# Patient Record
Sex: Female | Born: 1975 | Race: Black or African American | Hispanic: No | Marital: Married | Smoking: Never smoker
Health system: Southern US, Community
[De-identification: ages and names within clinical notes are randomized; demographics above are authoritative.]

## PROBLEM LIST (undated history)

## (undated) DIAGNOSIS — D649 Anemia, unspecified: Secondary | ICD-10-CM

## (undated) HISTORY — PX: OVARIAN CYST REMOVAL: SHX89

## (undated) HISTORY — DX: Anemia, unspecified: D64.9

## (undated) HISTORY — PX: OOPHORECTOMY: SHX86

---

## 2012-12-21 ENCOUNTER — Ambulatory Visit: Payer: Self-pay | Admitting: Family Medicine

## 2012-12-27 ENCOUNTER — Encounter: Payer: Self-pay | Admitting: Obstetrics & Gynecology

## 2013-01-02 ENCOUNTER — Encounter: Payer: Self-pay | Admitting: Obstetrics & Gynecology

## 2013-01-02 ENCOUNTER — Ambulatory Visit: Payer: BC Managed Care – PPO | Admitting: Family Medicine

## 2013-01-02 ENCOUNTER — Ambulatory Visit (INDEPENDENT_AMBULATORY_CARE_PROVIDER_SITE_OTHER): Payer: BC Managed Care – PPO | Admitting: Obstetrics & Gynecology

## 2013-01-02 VITALS — BP 105/64 | HR 85 | Temp 97.2°F | Resp 18 | Ht 68.0 in | Wt 158.0 lb

## 2013-01-02 DIAGNOSIS — Z9889 Other specified postprocedural states: Secondary | ICD-10-CM

## 2013-01-02 DIAGNOSIS — Z90721 Acquired absence of ovaries, unilateral: Secondary | ICD-10-CM

## 2013-01-02 DIAGNOSIS — Z87410 Personal history of cervical dysplasia: Secondary | ICD-10-CM

## 2013-01-02 DIAGNOSIS — Z1151 Encounter for screening for human papillomavirus (HPV): Secondary | ICD-10-CM

## 2013-01-02 DIAGNOSIS — Z01419 Encounter for gynecological examination (general) (routine) without abnormal findings: Secondary | ICD-10-CM

## 2013-01-02 NOTE — Progress Notes (Signed)
  Subjective:     Anne Hooper is a 37 y.o. female here for a routine exam.  Current complaints: wants birth control, has tried most methods.  Considering another nexplanon or paragard.  Personal health questionnaire reviewed: yes.   Gynecologic History Patient's last menstrual period was 12/17/2012. Contraception: coitus interruptus Last Pap: 2013. Results were: normal (in [place]) Last mammogram: n/a. Results were: n/a  Obstetric History OB History   Grav Para Term Preterm Abortions TAB SAB Ect Mult Living   3 3        3      # Outc Date GA Lbr Len/2nd Wgt Sex Del Anes PTL Lv   1 PAR            2 PAR            3 PAR                The following portions of the patient's history were reviewed and updated as appropriate: allergies, current medications, past family history, past medical history, past social history, past surgical history and problem list.  Review of Systems Pertinent items are noted in HPI.    Objective:   Filed Vitals:   01/02/13 1028  BP: 105/64  Pulse: 85  Temp: 97.2 F (36.2 C)  Resp: 18  Height: 5\' 8"  (1.727 m)  Weight: 158 lb (71.668 kg)  SpO2: 98%      Vitals:  WNL General appearance: alert, cooperative and no distress Head: Normocephalic, without obvious abnormality, atraumatic Eyes: negative Throat: lips, mucosa, and tongue normal; teeth and gums normal Lungs: clear to auscultation bilaterally Breasts: normal appearance, no masses or tenderness, No nipple retraction or dimpling, No nipple discharge or bleeding Heart: regular rate and rhythm Abdomen: soft, non-tender; bowel sounds normal; no masses,  no organomegaly Pelvic: cervix normal in appearance, external genitalia normal, no adnexal masses or tenderness, no bladder tenderness, no cervical motion tenderness, perianal skin: no external genital warts noted, urethra without abnormality or discharge, uterus retroverted with small fibroids noted, and vagina normal without  discharge Extremities: no edema, redness or tenderness in the calves or thighs Skin: no lesions or rash Lymph nodes: Axillary adenopathy: none        Assessment:    Healthy female exam.    Plan:    Education reviewed: self breast exams and skin cancer screening. Contraception: coitus interruptus. Follow up in: 3 weeks.  Getting records from [place] practice about ovarian surgery, pap smears / LEEP.

## 2013-01-02 NOTE — Patient Instructions (Addendum)
Intrauterine Device Information An intrauterine device (IUD) is inserted into your uterus and prevents pregnancy. There are 2 types of IUDs available:  Copper IUD. This type of IUD is wrapped in copper wire and is placed inside the uterus. Copper makes the uterus and fallopian tubes produce a fluid that kills sperm. The copper IUD can stay in place for 10 years.  Hormone IUD. This type of IUD contains the hormone progestin (synthetic progesterone). The hormone thickens the cervical mucus and prevents sperm from entering the uterus, and it also thins the uterine lining to prevent implantation of a fertilized egg. The hormone can weaken or kill the sperm that get into the uterus. The hormone IUD can stay in place for 5 years. Your caregiver will make sure you are a good candidate for a contraceptive IUD. Discuss with your caregiver the possible side effects. ADVANTAGES  It is highly effective, reversible, long-acting, and low maintenance.  There are no estrogen-related side effects.  An IUD can be used when breastfeeding.  It is not associated with weight gain.  It works immediately after insertion.  The copper IUD does not interfere with your female hormones.  The progesterone IUD can make heavy menstrual periods lighter.  The progesterone IUD can be used for 5 years.  The copper IUD can be used for 10 years. DISADVANTAGES  The progesterone IUD can be associated with irregular bleeding patterns.  The copper IUD can make your menstrual flow heavier and more painful.  You may experience cramping and vaginal bleeding after insertion. Document Released: 08/09/2004 Document Revised: 11/28/2011 Document Reviewed: 01/08/2011 [facility] Patient Information 2013 [place], [place]. Contraception Choices Contraception (birth control) is the use of any methods or devices to prevent pregnancy. Below are some methods to help avoid pregnancy. HORMONAL METHODS   Contraceptive implant. This is a  thin, plastic tube containing progesterone hormone. It does not contain estrogen hormone. Your caregiver inserts the tube in the inner part of the upper arm. The tube can remain in place for up to 3 years. After 3 years, the implant must be removed. The implant prevents the ovaries from releasing an egg (ovulation), thickens the cervical mucus which prevents sperm from entering the uterus, and thins the lining of the inside of the uterus.  Progesterone-only injections. These injections are given every 3 months by your caregiver to prevent pregnancy. This synthetic progesterone hormone stops the ovaries from releasing eggs. It also thickens cervical mucus and changes the uterine lining. This makes it harder for sperm to survive in the uterus.  Birth control pills. These pills contain estrogen and progesterone hormone. They work by stopping the egg from forming in the ovary (ovulation). Birth control pills are prescribed by a caregiver.Birth control pills can also be used to treat heavy periods.  Minipill. This type of birth control pill contains only the progesterone hormone. They are taken every day of each month and must be prescribed by your caregiver.  Birth control patch. The patch contains hormones similar to those in birth control pills. It must be changed once a week and is prescribed by a caregiver.  Vaginal ring. The ring contains hormones similar to those in birth control pills. It is left in the vagina for 3 weeks, removed for 1 week, and then a new one is put back in place. The patient must be comfortable inserting and removing the ring from the vagina.A caregiver's prescription is necessary.  Emergency contraception. Emergency contraceptives prevent pregnancy after unprotected sexual intercourse. This pill can  be taken right after sex or up to 5 days after unprotected sex. It is most effective the sooner you take the pills after having sexual intercourse. Emergency contraceptive pills are  available without a prescription. Check with your pharmacist. Do not use emergency contraception as your only form of birth control. BARRIER METHODS   Female condom. This is a thin sheath (latex or rubber) that is worn over the penis during sexual intercourse. It can be used with spermicide to increase effectiveness.  Female condom. This is a soft, loose-fitting sheath that is put into the vagina before sexual intercourse.  Diaphragm. This is a soft, latex, dome-shaped barrier that must be fitted by a caregiver. It is inserted into the vagina, along with a spermicidal jelly. It is inserted before intercourse. The diaphragm should be left in the vagina for 6 to 8 hours after intercourse.  Cervical cap. This is a round, soft, latex or plastic cup that fits over the cervix and must be fitted by a caregiver. The cap can be left in place for up to 48 hours after intercourse.  Sponge. This is a soft, circular piece of polyurethane foam. The sponge has spermicide in it. It is inserted into the vagina after wetting it and before sexual intercourse.  Spermicides. These are chemicals that kill or block sperm from entering the cervix and uterus. They come in the form of creams, jellies, suppositories, foam, or tablets. They do not require a prescription. They are inserted into the vagina with an applicator before having sexual intercourse. The process must be repeated every time you have sexual intercourse. INTRAUTERINE CONTRACEPTION  Intrauterine device (IUD). This is a T-shaped device that is put in a woman's uterus during a menstrual period to prevent pregnancy. There are 2 types:  Copper IUD. This type of IUD is wrapped in copper wire and is placed inside the uterus. Copper makes the uterus and fallopian tubes produce a fluid that kills sperm. It can stay in place for 10 years.  Hormone IUD. This type of IUD contains the hormone progestin (synthetic progesterone). The hormone thickens the cervical mucus  and prevents sperm from entering the uterus, and it also thins the uterine lining to prevent implantation of a fertilized egg. The hormone can weaken or kill the sperm that get into the uterus. It can stay in place for 5 years. PERMANENT METHODS OF CONTRACEPTION  Female tubal ligation. This is when the woman's fallopian tubes are surgically sealed, tied, or blocked to prevent the egg from traveling to the uterus.  Female sterilization. This is when the female has the tubes that carry sperm tied off (vasectomy).This blocks sperm from entering the vagina during sexual intercourse. After the procedure, the man can still ejaculate fluid (semen). NATURAL PLANNING METHODS  Natural family planning. This is not having sexual intercourse or using a barrier method (condom, diaphragm, cervical cap) on days the woman could become pregnant.  Calendar method. This is keeping track of the length of each menstrual cycle and identifying when you are fertile.  Ovulation method. This is avoiding sexual intercourse during ovulation.  Symptothermal method. This is avoiding sexual intercourse during ovulation, using a thermometer and ovulation symptoms.  Post-ovulation method. This is timing sexual intercourse after you have ovulated. Regardless of which type or method of contraception you choose, it is important that you use condoms to protect against the transmission of sexually transmitted diseases (STDs). Talk with your caregiver about which form of contraception is most appropriate for you. Document  Released: 09/05/2005 Document Revised: 11/28/2011 Document Reviewed: 01/12/2011 [facility] Patient Information 2013 [place], [place].

## 2013-01-18 ENCOUNTER — Encounter: Payer: Self-pay | Admitting: Obstetrics & Gynecology

## 2013-01-18 ENCOUNTER — Encounter: Payer: Self-pay | Admitting: *Deleted

## 2013-01-18 ENCOUNTER — Telehealth: Payer: Self-pay | Admitting: *Deleted

## 2013-01-18 NOTE — Telephone Encounter (Signed)
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  Pt will need pap in 1 year.  Pt to be notified by RN. ------

## 2013-01-18 NOTE — Telephone Encounter (Signed)
Pap letter mailed to pt's home address.

## 2013-05-22 ENCOUNTER — Ambulatory Visit: Payer: BC Managed Care – PPO | Admitting: Family Medicine

## 2013-06-21 ENCOUNTER — Ambulatory Visit (INDEPENDENT_AMBULATORY_CARE_PROVIDER_SITE_OTHER): Payer: BC Managed Care – PPO

## 2013-06-21 ENCOUNTER — Ambulatory Visit (INDEPENDENT_AMBULATORY_CARE_PROVIDER_SITE_OTHER): Payer: BC Managed Care – PPO | Admitting: Sports Medicine

## 2013-06-21 ENCOUNTER — Encounter: Payer: Self-pay | Admitting: Sports Medicine

## 2013-06-21 ENCOUNTER — Ambulatory Visit: Payer: BC Managed Care – PPO | Admitting: Physician Assistant

## 2013-06-21 VITALS — BP 107/62 | HR 85 | Wt 151.0 lb

## 2013-06-21 DIAGNOSIS — M542 Cervicalgia: Secondary | ICD-10-CM

## 2013-06-21 DIAGNOSIS — M25469 Effusion, unspecified knee: Secondary | ICD-10-CM

## 2013-06-21 DIAGNOSIS — Z0000 Encounter for general adult medical examination without abnormal findings: Secondary | ICD-10-CM | POA: Insufficient documentation

## 2013-06-21 DIAGNOSIS — M25569 Pain in unspecified knee: Secondary | ICD-10-CM

## 2013-06-21 DIAGNOSIS — D72819 Decreased white blood cell count, unspecified: Secondary | ICD-10-CM

## 2013-06-21 DIAGNOSIS — M222X1 Patellofemoral disorders, right knee: Secondary | ICD-10-CM

## 2013-06-21 DIAGNOSIS — Z299 Encounter for prophylactic measures, unspecified: Secondary | ICD-10-CM

## 2013-06-21 DIAGNOSIS — G44209 Tension-type headache, unspecified, not intractable: Secondary | ICD-10-CM | POA: Insufficient documentation

## 2013-06-21 MED ORDER — MELOXICAM 15 MG PO TABS: ORAL_TABLET | ORAL | Status: DC

## 2013-06-21 NOTE — Progress Notes (Signed)
  Subjective:    CC: Establish care.   HPI:  Neck pain: Localized from the right side of the neck up into the occiput. Not positional, hasn't tried anything. No pain radiating down into her arms. No trauma.  Bilateral knee pain: Present for weeks, anterior, worse when going up and down stairs, squatting, or sitting with knees bent. Hasn't tried any medications or rehabilitation for this. Some grind present.  Cervical cancer screening: History of cervical dysplasia status post LEEP, currently being seen by OB/GYN, Dr. Penne Lash.  Preventive measure: Due for flu vaccine, up-to-date on tetanus.  Past medical history, Surgical history, Family history not pertinant except as noted below, Social history, Allergies, and medications have been entered into the medical record, reviewed, and no changes needed.   Review of Systems: No headache, visual changes, nausea, vomiting, diarrhea, constipation, dizziness, abdominal pain, skin rash, fevers, chills, night sweats, swollen lymph nodes, weight loss, chest pain, body aches, joint swelling, muscle aches, shortness of breath, mood changes, visual or auditory hallucinations.  Objective:    General: Well Developed, well nourished, and in no acute distress.  Neuro: Alert and oriented x3, extra-ocular muscles intact, sensation grossly intact.  HEENT: Normocephalic, atraumatic, pupils equal round reactive to light, neck supple, no masses, no lymphadenopathy, thyroid nonpalpable.  Skin: Warm and dry, no rashes noted.  Cardiac: Regular rate and rhythm, no murmurs rubs or gallops.  Respiratory: Clear to auscultation bilaterally. Not using accessory muscles, speaking in full sentences.  Abdominal: Soft, nontender, nondistended, positive bowel sounds, no masses, no organomegaly.  Neck: Inspection unremarkable. No palpable stepoffs. Negative Spurling's maneuver. Full neck range of motion Grip strength and sensation normal in bilateral hands Strength good C4 to  T1 distribution No sensory change to C4 to T1 Negative Hoffman sign bilaterally Reflexes normal Bilateral Knee: Normal to inspection with no erythema or effusion or obvious bony abnormalities. Palpation normal with no warmth, joint line tenderness, patellar tenderness, or condyle tenderness. ROM full in flexion and extension and lower leg rotation. Ligaments with solid consistent endpoints including ACL, PCL, LCL, MCL. Negative Mcmurray's, Apley's, and Thessalonian tests. Painful patellar compression with crepitus. Patellar and quadriceps tendons unremarkable. Hamstring and quadriceps strength is normal.   Cervical spine x-rays were reviewed, and show some loss of the normal lordosis.  Knee xrays show patella alta, otherwise negative.  Impression and Recommendations:    The patient was counselled, risk factors were discussed, anticipatory guidance given.

## 2013-06-21 NOTE — Assessment & Plan Note (Signed)
We will start conservatively with Mobic, home exercises, x-rays. Return in one month, consider formal PT if no better. Certainly injection may be in her future.

## 2013-06-21 NOTE — Assessment & Plan Note (Signed)
Complete physical performed today. Flu vaccine. Up-to-date on tetanus 6 years ago. Cervical cancer screening is currently being managed by OB/GYN.

## 2013-06-21 NOTE — Assessment & Plan Note (Signed)
Likely related to some cervical spasm. Home exercises, Mobic, x-rays. Return in one month.

## 2013-06-24 ENCOUNTER — Ambulatory Visit: Payer: BC Managed Care – PPO | Admitting: Physician Assistant

## 2013-06-24 LAB — CBC
HCT: 33.7 % — ABNORMAL LOW (ref 36.0–46.0)
Hemoglobin: 11 g/dL — ABNORMAL LOW (ref 12.0–15.0)
MCH: 27.2 pg (ref 26.0–34.0)
MCHC: 32.6 g/dL (ref 30.0–36.0)
MCV: 83.2 fL (ref 78.0–100.0)
Platelets: 222 K/uL (ref 150–400)
RBC: 4.05 MIL/uL (ref 3.87–5.11)
RDW: 12.5 % (ref 11.5–15.5)
WBC: 2.9 10*3/uL — ABNORMAL LOW (ref 4.0–10.5)

## 2013-06-24 LAB — TSH: TSH: 1.004 u[IU]/mL (ref 0.350–4.500)

## 2013-06-24 LAB — COMPREHENSIVE METABOLIC PANEL
Albumin: 4.5 g/dL (ref 3.5–5.2)
Alkaline Phosphatase: 49 U/L (ref 39–117)
BUN: 13 mg/dL (ref 6–23)
CO2: 28 mEq/L (ref 19–32)
Calcium: 10 mg/dL (ref 8.4–10.5)
Chloride: 105 mEq/L (ref 96–112)
Glucose, Bld: 86 mg/dL (ref 70–99)
Potassium: 4.2 mEq/L (ref 3.5–5.3)
Sodium: 137 mEq/L (ref 135–145)
Total Protein: 7.4 g/dL (ref 6.0–8.3)

## 2013-06-24 LAB — COMPREHENSIVE METABOLIC PANEL WITH GFR
ALT: 14 U/L (ref 0–35)
AST: 15 U/L (ref 0–37)
Creat: 0.75 mg/dL (ref 0.50–1.10)
Total Bilirubin: 0.5 mg/dL (ref 0.3–1.2)

## 2013-06-24 LAB — LIPID PANEL
Cholesterol: 118 mg/dL (ref 0–200)
HDL: 50 mg/dL (ref >39)
LDL Cholesterol: 58 mg/dL (ref 0–99)
Total CHOL/HDL Ratio: 2.4 ratio
Triglycerides: 49 mg/dL (ref <150)
VLDL: 10 mg/dL (ref 0–40)

## 2013-06-25 DIAGNOSIS — D759 Disease of blood and blood-forming organs, unspecified: Secondary | ICD-10-CM | POA: Insufficient documentation

## 2013-06-25 NOTE — Addendum Note (Signed)
Addended by: Monica Becton on: 06/25/2013 09:12 AM   Modules accepted: Orders

## 2013-07-15 ENCOUNTER — Telehealth: Payer: Self-pay | Admitting: Sports Medicine

## 2013-07-15 NOTE — Telephone Encounter (Signed)
Order has been faxed to lab. Emmett Arntz,CMA

## 2013-07-15 NOTE — Telephone Encounter (Signed)
Patient called request to know if lab order was sent downstairs she was advised to have labs re-done-vew. Thanks

## 2013-07-16 ENCOUNTER — Other Ambulatory Visit: Payer: Self-pay | Admitting: Sports Medicine

## 2013-07-16 LAB — CBC WITH DIFFERENTIAL/PLATELET
Basophils Absolute: 0 10*3/uL (ref 0.0–0.1)
Basophils Relative: 1 % (ref 0–1)
Eosinophils Absolute: 0 K/uL (ref 0.0–0.7)
Eosinophils Relative: 1 % (ref 0–5)
HCT: 32.7 % — ABNORMAL LOW (ref 36.0–46.0)
Hemoglobin: 10.5 g/dL — ABNORMAL LOW (ref 12.0–15.0)
Lymphocytes Relative: 42 % (ref 12–46)
Lymphs Abs: 1.4 10*3/uL (ref 0.7–4.0)
MCH: 27.5 pg (ref 26.0–34.0)
MCHC: 32.1 g/dL (ref 30.0–36.0)
MCV: 85.6 fL (ref 78.0–100.0)
Monocytes Absolute: 0.3 10*3/uL (ref 0.1–1.0)
Monocytes Relative: 9 % (ref 3–12)
Neutro Abs: 1.6 10*3/uL — ABNORMAL LOW (ref 1.7–7.7)
Neutrophils Relative %: 47 % (ref 43–77)
Platelets: 214 10*3/uL (ref 150–400)
RBC: 3.82 MIL/uL — ABNORMAL LOW (ref 3.87–5.11)
RDW: 12.9 % (ref 11.5–15.5)
WBC: 3.4 10*3/uL — ABNORMAL LOW (ref 4.0–10.5)

## 2013-10-29 ENCOUNTER — Other Ambulatory Visit: Payer: Self-pay | Admitting: Sports Medicine

## 2014-06-09 ENCOUNTER — Encounter: Payer: Self-pay | Admitting: Physician Assistant

## 2014-06-09 ENCOUNTER — Ambulatory Visit (INDEPENDENT_AMBULATORY_CARE_PROVIDER_SITE_OTHER): Payer: BC Managed Care – PPO | Admitting: Physician Assistant

## 2014-06-09 VITALS — BP 110/67 | HR 106 | Ht 68.0 in | Wt 154.0 lb

## 2014-06-09 DIAGNOSIS — L729 Follicular cyst of the skin and subcutaneous tissue, unspecified: Secondary | ICD-10-CM

## 2014-06-09 DIAGNOSIS — L723 Sebaceous cyst: Secondary | ICD-10-CM | POA: Diagnosis not present

## 2014-06-09 MED ORDER — DOXYCYCLINE HYCLATE 100 MG PO TABS: 100.0000 mg | ORAL_TABLET | Freq: Two times a day (BID) | ORAL | Status: DC

## 2014-06-09 NOTE — Progress Notes (Signed)
   Subjective:    Patient ID: Anne Hooper, female    DOB: Apr 23, 1976, 38 y.o.   MRN: [id number]  HPI Pt is a 38 yo female who presents to the clinic with tender bump on 3 year c-section incision scar. Noticed bump and tenderness this morning. Never had any other pain or problems. No fever, chills, n/v/d. Pt is currently on period. Has some mild abdominal cramping.     Review of Systems  All other systems reviewed and are negative.      Objective:   Physical Exam  Abdominal:            Assessment & Plan:  Skin cyst- appears to be sebaceous or due to trauma/scratch. ressured pt is not sutures used 3 years ago.Warm compresses. Keep clean with warm soap and water. Start doxycycline if not improving or enlarging come back to have drained.

## 2014-06-09 NOTE — Patient Instructions (Signed)
Start antibiotic.  Wash warm soap and water.

## 2014-06-11 ENCOUNTER — Ambulatory Visit (INDEPENDENT_AMBULATORY_CARE_PROVIDER_SITE_OTHER): Payer: BC Managed Care – PPO | Admitting: Sports Medicine

## 2014-06-11 ENCOUNTER — Encounter: Payer: Self-pay | Admitting: Sports Medicine

## 2014-06-11 VITALS — BP 105/62 | HR 87 | Ht 66.0 in | Wt 155.0 lb

## 2014-06-11 DIAGNOSIS — L910 Hypertrophic scar: Secondary | ICD-10-CM

## 2014-06-11 MED ORDER — TRAMADOL HCL 50 MG PO TABS: ORAL_TABLET | ORAL | Status: DC

## 2014-06-11 NOTE — Patient Instructions (Signed)
Incision Care °An incision is when a surgeon cuts into your body tissues. After surgery, the incision needs to be cared for properly to prevent infection.  °HOME CARE INSTRUCTIONS  °· Take all medicine as directed by your caregiver. Only take over-the-counter or prescription medicines for pain, discomfort, or fever as directed by your caregiver. °· Do not remove your bandage (dressing) or get your incision wet until your surgeon gives you permission. In the event that your dressing becomes wet, dirty, or starts to smell, change the dressing and call your surgeon for instructions as soon as possible. °· Take showers. Do not take tub baths, swim, or do anything that may soak the wound until it is healed. °· Resume your normal diet and activities as directed or allowed. °· Avoid lifting any weight until you are instructed otherwise. °· Use anti-itch antihistamine medicine as directed by your caregiver. The wound may itch when it is healing. Do not pick or scratch at the wound. °· Follow up with your caregiver for stitch (suture) or staple removal as directed. °· Drink enough fluids to keep your urine clear or pale yellow. °SEEK MEDICAL CARE IF:  °· You have redness, swelling, or increasing pain in the wound that is not controlled with medicine. °· You have drainage, blood, or pus coming from the wound that lasts longer than 1 day. °· You develop muscle aches, chills, or a general ill feeling. °· You notice a bad smell coming from the wound or dressing. °· Your wound edges separate after the sutures, staples, or skin adhesive strips have been removed. °· You develop persistent nausea or vomiting. °SEEK IMMEDIATE MEDICAL CARE IF:  °· You have a fever. °· You develop a rash. °· You develop dizzy episodes or faint while standing. °· You have difficulty breathing. °· You develop any reaction or side effects to medicine given. °MAKE SURE YOU:  °· Understand these instructions. °· Will watch your condition. °· Will get help  right away if you are not doing well or get worse. °Document Released: 03/25/2005 Document Revised: 11/28/2011 Document Reviewed: 10/30/2013 °[employer]® Patient Information ©2015 [employer]. This information is not intended to replace advice given to you by your health care provider. Make sure you discuss any questions you have with your health care provider. ° ° °

## 2014-06-11 NOTE — Assessment & Plan Note (Signed)
There appears to be some suture reaction with mild infection at the site of a prior C-section 4 years ago. I excised the entire hypertrophic scar and closed it with 6 simple interrupted sutures. She will complete her course of doxycycline, change dressing daily, and return to see me in one week for suture removal. Tramadol for pain.

## 2014-06-11 NOTE — Progress Notes (Signed)
Patient ID: Anne Hooper, female   DOB: 01-Jan-1976, 38 y.o.   MRN: [id number]  Subjective:    CC: Open wound over cesarean scar  HPI: Anne Hooper is a very pleasant 38 year old female who presents today with a painful, open abscess at the center of her nearly 17-year-old cesarean section scar. She first noticed this two days ago as a painful lump. That day (9/21), she was seen in our office by Anne Hooper, who instructed her to keep the area clean and start a 10 day course of doxycycline. She has now completed two days of doxycycline; however, she has returned today after the lump opened and began to drain last night. She has never had any pain around this incision in the past. Two days ago, she did have some subjective fevers and chills with fatigue; however, these symptoms have now resolved.  Past medical history, Surgical history, Family history not pertinant except as noted below, Social history, Allergies, and medications have been entered into the medical record, reviewed, and no changes needed.   Review of Systems: Pertinent findings as above. No chest pain or shortness of breath.   Objective:    General: Well developed, well nourished, and in no acute distress.  Neuro: Alert and oriented x3, extra-ocular muscles intact, sensation grossly intact.  HEENT: Normocephalic, atraumatic, pupils equal round reactive to light, neck supple, no masses, no lymphadenopathy, thyroid nonpalpable.  Skin: Warm and dry, no rashes. 1 cm, open and non-draining abscess present at the center of her cesarean scar. There is hypertrophy of the scar tissue present. Tenderness to palpation along the scar and over the abscess. Cardiac: Regular rate and rhythm, no murmurs rubs or gallops, no lower extremity edema.  Respiratory: Clear to auscultation bilaterally. Not using accessory muscles, speaking in full sentences.  Procedure:  Excision of  3.5 cm benign skin lesion/hypertrophic scar/suture reaction Risks,  benefits, and alternatives explained and consent obtained. Time out conducted. Surface prepped with alcohol. 5cc lidocaine with epinephine infiltrated in a field block. Adequate anesthesia ensured. Area prepped and draped in a sterile fashion. Excision performed with: Using a #11 blade I made an elliptical incision around the draining suture reaction as well as the entirety of the hypertrophic portion of her cesarean scar. Using sharp and blunt dissection I removed the entirety of it en bloc, and sent off what I suspected to be a suture reaction for pathology analysis. I then placed six 3-0 Vicryl sutures in a simple interrupted pattern to appropriately close the wound. Hemostasis achieved. Pt stable.  Impression and Recommendations:   Suture Reaction: This open abscess appears to be a suture reaction with mild infection at the site of her prior cesarean section 4 years ago. - Hypertrophic scar and abscess excised in the office today, closed with 6 simple interrupted sutures. - Complete course of doxycycline - Change dressing daily - Tramadol for pain -  Return in one week for suture removal

## 2014-06-11 NOTE — Progress Notes (Deleted)

## 2014-06-11 NOTE — Addendum Note (Signed)
Addended by: Doree Albee on: 06/11/2014 03:23 PM   Modules accepted: Orders

## 2014-06-18 ENCOUNTER — Ambulatory Visit (INDEPENDENT_AMBULATORY_CARE_PROVIDER_SITE_OTHER): Payer: BC Managed Care – PPO | Admitting: Sports Medicine

## 2014-06-18 ENCOUNTER — Other Ambulatory Visit (HOSPITAL_COMMUNITY)
Admission: RE | Admit: 2014-06-18 | Discharge: 2014-06-18 | Disposition: A | Discharge status: Home / Self Care | Payer: BC Managed Care – PPO | Source: Ambulatory Visit | Attending: Sports Medicine | Admitting: Sports Medicine

## 2014-06-18 ENCOUNTER — Encounter: Payer: Self-pay | Admitting: Sports Medicine

## 2014-06-18 VITALS — BP 108/65 | HR 112 | Ht 66.5 in | Wt 155.0 lb

## 2014-06-18 DIAGNOSIS — Z01419 Encounter for gynecological examination (general) (routine) without abnormal findings: Secondary | ICD-10-CM | POA: Diagnosis present

## 2014-06-18 DIAGNOSIS — Z113 Encounter for screening for infections with a predominantly sexual mode of transmission: Secondary | ICD-10-CM | POA: Diagnosis present

## 2014-06-18 DIAGNOSIS — L910 Hypertrophic scar: Secondary | ICD-10-CM

## 2014-06-18 DIAGNOSIS — Z1151 Encounter for screening for human papillomavirus (HPV): Secondary | ICD-10-CM | POA: Diagnosis present

## 2014-06-18 DIAGNOSIS — Z87410 Personal history of cervical dysplasia: Secondary | ICD-10-CM

## 2014-06-18 DIAGNOSIS — Z0000 Encounter for general adult medical examination without abnormal findings: Secondary | ICD-10-CM

## 2014-06-18 DIAGNOSIS — D759 Disease of blood and blood-forming organs, unspecified: Secondary | ICD-10-CM

## 2014-06-18 NOTE — Assessment & Plan Note (Signed)
Doing well post excision. Sutures removed today. Finish doxycycline.

## 2014-06-18 NOTE — Progress Notes (Signed)
  Subjective:    CC: Complete physical   HPI:  Preventative measures: Cervical cancer screening today, last Pap smear was normal one year ago but was positive for HPV. She is to do the rest of her cervical cancer screening here.  Postop: Surgical site is doing well, only a little bit of pain, ready to get sutures out.  Past medical history, Surgical history, Family history not pertinant except as noted below, Social history, Allergies, and medications have been entered into the medical record, reviewed, and no changes needed.   Review of Systems: No headache, visual changes, nausea, vomiting, diarrhea, constipation, dizziness, abdominal pain, skin rash, fevers, chills, night sweats, swollen lymph nodes, weight loss, chest pain, body aches, joint swelling, muscle aches, shortness of breath, mood changes, visual or auditory hallucinations.  Objective:    General: Well Developed, well nourished, and in no acute distress.  Neuro: Alert and oriented x3, extra-ocular muscles intact, sensation grossly intact. Cranial nerves II through XII are intact, motor, sensory, and coordinative functions are all intact. HEENT: Normocephalic, atraumatic, pupils equal round reactive to light, neck supple, no masses, no lymphadenopathy, thyroid nonpalpable. Oropharynx, nasopharynx, external ear canals are unremarkable. Skin: Warm and dry, no rashes noted.  Cardiac: Regular rate and rhythm, no murmurs rubs or gallops.  Respiratory: Clear to auscultation bilaterally. Not using accessory muscles, speaking in full sentences.  Abdominal: Soft, nontender, nondistended, positive bowel sounds, no masses, no organomegaly.  Musculoskeletal: Shoulder, elbow, wrist, hip, knee, ankle stable, and with full range of motion. Genital: External vulva unremarkable, vault unremarkable, cervix unremarkable, adnexa unremarkable.  Impression and Recommendations:    The patient was counselled, risk factors were discussed, anticipatory  guidance given.

## 2014-06-18 NOTE — Assessment & Plan Note (Signed)
Per patient, history of cryo-. Repeat Pap, rechecking HPV. Return in one year.

## 2014-06-18 NOTE — Assessment & Plan Note (Signed)
Physical exam with Pap as above.

## 2014-06-18 NOTE — Addendum Note (Signed)
Addended by: Doree Albee on: 06/18/2014 09:12 AM   Modules accepted: Orders

## 2014-06-19 LAB — CYTOLOGY - PAP

## 2014-07-21 ENCOUNTER — Encounter: Payer: Self-pay | Admitting: Sports Medicine

## 2014-08-21 ENCOUNTER — Ambulatory Visit: Payer: BC Managed Care – PPO | Admitting: Sports Medicine

## 2014-08-21 ENCOUNTER — Emergency Department
Admission: EM | Admit: 2014-08-21 | Discharge: 2014-08-21 | Discharge status: Absent without leave | Payer: Self-pay | Source: Home / Self Care

## 2014-09-02 ENCOUNTER — Ambulatory Visit (INDEPENDENT_AMBULATORY_CARE_PROVIDER_SITE_OTHER): Payer: BC Managed Care – PPO | Admitting: Sports Medicine

## 2014-09-02 ENCOUNTER — Telehealth: Payer: Self-pay | Admitting: *Deleted

## 2014-09-02 ENCOUNTER — Encounter: Payer: Self-pay | Admitting: Sports Medicine

## 2014-09-02 VITALS — BP 114/63 | HR 90 | Ht 68.0 in | Wt 152.0 lb

## 2014-09-02 DIAGNOSIS — M542 Cervicalgia: Secondary | ICD-10-CM | POA: Diagnosis not present

## 2014-09-02 DIAGNOSIS — L910 Hypertrophic scar: Secondary | ICD-10-CM

## 2014-09-02 DIAGNOSIS — M6530 Trigger finger, unspecified finger: Secondary | ICD-10-CM | POA: Diagnosis not present

## 2014-09-02 MED ORDER — MELOXICAM 15 MG PO TABS: ORAL_TABLET | ORAL | Status: DC

## 2014-09-02 NOTE — Assessment & Plan Note (Signed)
With what feels to be a palpable ganglion cyst. Ultrasound guided injection as above. Return in one month.

## 2014-09-02 NOTE — Assessment & Plan Note (Signed)
Improved significantly after surgical excision. Intralesional steroid injection today.

## 2014-09-02 NOTE — Progress Notes (Signed)
  Subjective:    CC: Hand pain, headache  HPI: Hand pain: Right fourth finger, there is a palpable and tender nodule just distal to the MCP. Symptoms are moderate, persistent.  Hypertrophic scar: Doing extremely well. She does have a persistent area of hypertrophy at the middle of the incision.  Headaches: Localized in the posterior neck with radiation over the occiput, worse with turning the head to the right and extension. No radicular symptoms into the hands, no nausea, no trauma, does not wake her from sleep.  Past medical history, Surgical history, Family history not pertinant except as noted below, Social history, Allergies, and medications have been entered into the medical record, reviewed, and no changes needed.   Review of Systems: No fevers, chills, night sweats, weight loss, chest pain, or shortness of breath.   Objective:    General: Well Developed, well nourished, and in no acute distress.  Neuro: Alert and oriented x3, extra-ocular muscles intact, sensation grossly intact.  HEENT: Normocephalic, atraumatic, pupils equal round reactive to light, neck supple, no masses, no lymphadenopathy, thyroid nonpalpable.  Skin: Warm and dry, no rashes. Cardiac: Regular rate and rhythm, no murmurs rubs or gallops, no lower extremity edema.  Respiratory: Clear to auscultation bilaterally. Not using accessory muscles, speaking in full sentences. Neck: Negative spurling's Full neck range of motion Grip strength and sensation normal in bilateral hands Strength good C4 to T1 distribution No sensory change to C4 to T1 Reflexes normal Right hand: There is a palpable tender nodule that feels in close approximation with the flexor tendon sheath at the right fourth MCP.  Procedure: Real-time Ultrasound Guided Injection of right fourth flexor tendon sheath Device: GE Logiq E  Verbal informed consent obtained.  Time-out conducted.  Noted no overlying erythema, induration, or other signs of  local infection.  Skin prepped in a sterile fashion.  Local anesthesia: Topical Ethyl chloride.  With sterile technique and under real time ultrasound guidance:  1 mL kenalog 40, 4 mL lidocaine injected easily, I did see a ganglion cyst in close approximation of the flexor digitorum tendons. Completed without difficulty  Pain immediately resolved suggesting accurate placement of the medication.  Advised to call if fevers/chills, erythema, induration, drainage, or persistent bleeding.  Images permanently stored and available for review in the ultrasound unit.  Impression: Technically successful ultrasound guided injection.  Procedure:  Injection of midline abdominal intralesional steroid injection at Pfannenstiel incision Consent obtained and verified. Time-out conducted. Noted no overlying erythema, induration, or other signs of local infection. Skin prepped in a sterile fashion. Topical analgesic spray: Ethyl chloride. Completed without difficulty. Meds: 0.5 mL kenalog 40, 0.5 mL lidocaine injected easily. Pain immediately improved suggesting accurate placement of the medication. Advised to call if fevers/chills, erythema, induration, drainage, or persistent bleeding.  Impression and Recommendations:

## 2014-09-02 NOTE — Telephone Encounter (Signed)
MRI approval cervical spine [id number] and brain [id number] both w/o and are valid 09/02/14-10/01/14. Radiology notified. Margette Fast, CMA

## 2014-09-02 NOTE — Assessment & Plan Note (Signed)
Pain is starting to radiate over the back of her head, causing severe headaches. She has had over 6 weeks of conservative measures including NSAIDs and physician directed therapy. Meloxicam. MRI of the cervical spine and brain.

## 2014-09-03 LAB — CBC
HCT: 31.9 % — ABNORMAL LOW (ref 36.0–46.0)
Hemoglobin: 9.9 g/dL — ABNORMAL LOW (ref 12.0–15.0)
MCH: 25.6 pg — ABNORMAL LOW (ref 26.0–34.0)
MCHC: 31 g/dL (ref 30.0–36.0)
MCV: 82.4 fL (ref 78.0–100.0)
Platelets: 252 K/uL (ref 150–400)
RBC: 3.87 MIL/uL (ref 3.87–5.11)
RDW: 14.3 % (ref 11.5–15.5)
WBC: 3.4 10*3/uL — ABNORMAL LOW (ref 4.0–10.5)

## 2014-09-03 LAB — COMPREHENSIVE METABOLIC PANEL
AST: 18 U/L (ref 0–37)
Albumin: 4.3 g/dL (ref 3.5–5.2)
Alkaline Phosphatase: 45 U/L (ref 39–117)
BUN: 13 mg/dL (ref 6–23)
CO2: 26 mEq/L (ref 19–32)
Calcium: 9.7 mg/dL (ref 8.4–10.5)
Chloride: 104 mEq/L (ref 96–112)
Creat: 0.65 mg/dL (ref 0.50–1.10)
Glucose, Bld: 87 mg/dL (ref 70–99)
Potassium: 4.2 mEq/L (ref 3.5–5.3)
Sodium: 139 mEq/L (ref 135–145)
Total Protein: 7.1 g/dL (ref 6.0–8.3)

## 2014-09-03 LAB — COMPREHENSIVE METABOLIC PANEL WITH GFR
ALT: 17 U/L (ref 0–35)
Total Bilirubin: 0.5 mg/dL (ref 0.2–1.2)

## 2014-09-03 LAB — LIPID PANEL
Cholesterol: 116 mg/dL (ref 0–200)
HDL: 55 mg/dL (ref >39)
LDL Cholesterol: 52 mg/dL (ref 0–99)
Total CHOL/HDL Ratio: 2.1 ratio
Triglycerides: 45 mg/dL (ref <150)
VLDL: 9 mg/dL (ref 0–40)

## 2014-09-03 LAB — TSH: TSH: 0.802 u[IU]/mL (ref 0.350–4.500)

## 2014-09-03 LAB — HEMOGLOBIN A1C
Hgb A1c MFr Bld: 5.6 % (ref <5.7)
Mean Plasma Glucose: 114 mg/dL (ref <117)

## 2014-09-03 NOTE — Assessment & Plan Note (Signed)
Worsening anemia, with persistent leukopenia despite iron supplementation. At this point we are going to refer her to hematology.

## 2014-09-03 NOTE — Addendum Note (Signed)
Addended by: Silverio Decamp on: 09/03/2014 09:05 AM   Modules accepted: Orders

## 2014-09-16 ENCOUNTER — Telehealth: Payer: Self-pay | Admitting: Hematology & Oncology

## 2014-09-16 NOTE — Telephone Encounter (Signed)
Patient called today about new patient apt.  I gave patient the address, date/time, instructions to bring medication bottles, and updated insurance.

## 2014-09-17 ENCOUNTER — Encounter: Payer: Self-pay | Admitting: Family

## 2014-09-17 ENCOUNTER — Other Ambulatory Visit (HOSPITAL_BASED_OUTPATIENT_CLINIC_OR_DEPARTMENT_OTHER): Payer: BC Managed Care – PPO | Admitting: Lab

## 2014-09-17 ENCOUNTER — Ambulatory Visit: Payer: BC Managed Care – PPO

## 2014-09-17 ENCOUNTER — Inpatient Hospital Stay (HOSPITAL_BASED_OUTPATIENT_CLINIC_OR_DEPARTMENT_OTHER): Payer: BC Managed Care – PPO | Admitting: Family

## 2014-09-17 VITALS — BP 115/67 | HR 78 | Temp 97.8°F | Resp 18 | Ht 68.0 in | Wt 154.0 lb

## 2014-09-17 DIAGNOSIS — D500 Iron deficiency anemia secondary to blood loss (chronic): Secondary | ICD-10-CM

## 2014-09-17 DIAGNOSIS — D72819 Decreased white blood cell count, unspecified: Secondary | ICD-10-CM

## 2014-09-17 DIAGNOSIS — D509 Iron deficiency anemia, unspecified: Secondary | ICD-10-CM

## 2014-09-17 LAB — CBC WITH DIFFERENTIAL (CANCER CENTER ONLY)
BASO#: 0 10*3/uL (ref 0.0–0.2)
BASO%: 0.2 % (ref 0.0–2.0)
EOS%: 0.4 % (ref 0.0–7.0)
Eosinophils Absolute: 0 10*3/uL (ref 0.0–0.5)
HCT: 31.7 % — ABNORMAL LOW (ref 34.8–46.6)
HGB: 9.8 g/dL — ABNORMAL LOW (ref 11.6–15.9)
LYMPH#: 1.4 10*3/uL (ref 0.9–3.3)
LYMPH%: 30.8 % (ref 14.0–48.0)
MCH: 26.2 pg (ref 26.0–34.0)
MCHC: 30.9 g/dL — AB (ref 32.0–36.0)
MCV: 85 fL (ref 81–101)
MONO#: 0.5 10*3/uL (ref 0.1–0.9)
MONO%: 10.3 % (ref 0.0–13.0)
NEUT%: 58.3 % (ref 39.6–80.0)
NEUTROS ABS: 2.6 10*3/uL (ref 1.5–6.5)
Platelets: 186 10*3/uL (ref 145–400)
RBC: 3.74 10*6/uL (ref 3.70–5.32)
RDW: 14 % (ref 11.1–15.7)
WBC: 4.5 10*3/uL (ref 3.9–10.0)

## 2014-09-17 LAB — CHCC SATELLITE - SMEAR

## 2014-09-17 NOTE — Progress Notes (Signed)
Hematology/Oncology Consultation   Name: Anne Hooper      MRN: [id number]    Location: Room/bed info not found  Date: 09/17/2014 Time:2:44 PM   REFERRING PHYSICIAN:  Gwen Her Thekkekandam  REASON FOR CONSULT:  Leukopenia and anemia   DIAGNOSIS:  Iron deficiency anemia Benign ethnic associated leukopenia   HISTORY OF PRESENT ILLNESS: Anne Hooper is a very pleasant 38 yo African American female with a long history of leukopenia and iron deficiency anemia. She was on PO iron but stopped taking it over 4 years ago.  Today her Hgb 9.8 MCV 85 and WBC 4.5.  She is from [place] and has lived in the [place] for 18 years. She states that she did have malaria as a child. Her husband is a Government social research officer resident at [facility] in [place]. She lives here with their 3 children and works as a Art gallery manager for [employer].  She is feels tired at times. She denies fever, chills, n/v, cough, rash, dizziness, SOB, chest pain, palpitations, abdominal pain, constipation, diarrhea, blood in urine or stool. She does have headaches at times.  She has very heavy cycles. She is not on birth control. She also has uterine fibroids.  She has had one of her ovaries removed and had a C-section with her last child. All 3 of her children are healthy and she has not had a miscarriage.  No swelling, tenderness, numbness or tingling in her extremities.  Her appetite is good and she is drinking plenty of fluids. Her weight is stable.   She does not smoke or drink alcohol.  She has no personal or familial history of cancer, sickle cell, bleeding or clotting disorder.   ROS: All other 10 point review of systems is negative.   PAST MEDICAL HISTORY:   Past Medical History  Diagnosis Date  . Anemia     ALLERGIES: No Known Allergies    MEDICATIONS:  No current outpatient prescriptions on file prior to visit.   No current facility-administered medications on file prior to visit.     PAST SURGICAL HISTORY Past Surgical  History  Procedure Laterality Date  . Ovarian cyst removal    . Cesarean section    . Oophorectomy Left     FAMILY HISTORY: History reviewed. No pertinent family history.  SOCIAL HISTORY:  reports that she has never smoked. She does not have any smokeless tobacco history on file. She reports that she does not drink alcohol or use illicit drugs.  PERFORMANCE STATUS: The patient's performance status is 1 - Symptomatic but completely ambulatory  PHYSICAL EXAM: Most Recent Vital Signs: Blood pressure 115/67, pulse 78, temperature 97.8 F (36.6 C), temperature source Oral, resp. rate 18, height 5\' 8"  (1.727 m), weight 154 lb (69.854 kg). BP 115/67 mmHg  Pulse 78  Temp(Src) 97.8 F (36.6 C) (Oral)  Resp 18  Ht 5\' 8"  (1.727 m)  Wt 154 lb (69.854 kg)  BMI 23.42 kg/m2  General Appearance:    Alert, cooperative, no distress, appears stated age  Head:    Normocephalic, without obvious abnormality, atraumatic  Eyes:    PERRL, conjunctiva/corneas clear, EOM's intact, fundi    benign, both eyes        Throat:   Lips, mucosa, and tongue normal; teeth and gums normal  Neck:   Supple, symmetrical, trachea midline, no adenopathy;    thyroid:  no enlargement/tenderness/nodules; no carotid   bruit or JVD  Back:     Symmetric, no curvature, ROM normal, no CVA  tenderness  Lungs:     Clear to auscultation bilaterally, respirations unlabored  Chest Wall:    No tenderness or deformity   Heart:    Regular rate and rhythm, S1 and S2 normal, no murmur, rub   or gallop  Breast Exam:    No tenderness, masses, or nipple abnormality  Abdomen:     Soft, non-tender, bowel sounds active all four quadrants,    no masses, no organomegaly        Extremities:   Extremities normal, atraumatic, no cyanosis or edema  Pulses:   2+ and symmetric all extremities  Skin:   Skin color, texture, turgor normal, no rashes or lesions  Lymph nodes:   Cervical, supraclavicular, and axillary nodes normal  Neurologic:    CNII-XII intact, normal strength, sensation and reflexes    throughout    LABORATORY DATA:  Results for orders placed or performed in visit on 09/17/14 (from the past 48 hour(s))  CBC with Differential [facility])     Status: Abnormal   Collection Time: 09/17/14  2:08 PM  Result Value Ref Range   WBC 4.5 3.9 - 10.0 10e3/uL   RBC 3.74 3.70 - 5.32 10e6/uL   HGB 9.8 (L) 11.6 - 15.9 g/dL   HCT 31.7 (L) 34.8 - 46.6 %   MCV 85 81 - 101 fL   MCH 26.2 26.0 - 34.0 pg   MCHC 30.9 (L) 32.0 - 36.0 g/dL   RDW 14.0 11.1 - 15.7 %   Platelets 186 145 - 400 10e3/uL   NEUT# 2.6 1.5 - 6.5 10e3/uL   LYMPH# 1.4 0.9 - 3.3 10e3/uL   MONO# 0.5 0.1 - 0.9 10e3/uL   Eosinophils Absolute 0.0 0.0 - 0.5 10e3/uL   BASO# 0.0 0.0 - 0.2 10e3/uL   NEUT% 58.3 39.6 - 80.0 %   LYMPH% 30.8 14.0 - 48.0 %   MONO% 10.3 0.0 - 13.0 %   EOS% 0.4 0.0 - 7.0 %   BASO% 0.2 0.0 - 2.0 %      RADIOGRAPHY: No results found.     PATHOLOGY: None  ASSESSMENT/PLAN: Anne Hooper is a very pleasant 38 yo [place] American female with a long history of leukopenia and iron deficiency anemia. She was on PO iron but stopped taking it over 4 years ago.  Today her Hgb 9.8 MCV 85 and WBC 4.5. We will see what her iron studies.  Her blood smear was indicative of iron deficiency anemia.  We will get her back in next week for iron if needed.  We will see her back in 2 months for follow-up and labs.  All questions were answered. She knows to call the clinic with any problems, questions or concerns. We can certainly see her much sooner if necessary. The patient was discussed with Dr. Marin Olp and he is in agreement with the aforementioned.   [facility]

## 2014-09-18 ENCOUNTER — Other Ambulatory Visit: Payer: Self-pay | Admitting: Family

## 2014-09-18 ENCOUNTER — Telehealth: Payer: Self-pay | Admitting: Hematology & Oncology

## 2014-09-18 DIAGNOSIS — D509 Iron deficiency anemia, unspecified: Secondary | ICD-10-CM

## 2014-09-18 LAB — IRON AND TIBC CHCC
%SAT: 10 % — ABNORMAL LOW (ref 21–57)
Iron: 45 ug/dL (ref 41–142)
TIBC: 440 ug/dL (ref 236–444)
UIBC: 395 ug/dL — ABNORMAL HIGH (ref 120–384)

## 2014-09-18 LAB — FERRITIN CHCC: Ferritin: 8 ng/ml — ABNORMAL LOW (ref 9–269)

## 2014-09-18 NOTE — Telephone Encounter (Signed)
Pt aware of 1-6 and 1-14 iron

## 2014-09-22 LAB — HEMOGLOBINOPATHY EVALUATION
HGB A: 97.6 % (ref 96.8–97.8)
Hemoglobin Other: 0 %
Hgb A2 Quant: 2.4 % (ref 2.2–3.2)
Hgb F Quant: 0 % (ref 0.0–2.0)
Hgb S Quant: 0 %

## 2014-09-22 LAB — VITAMIN B12: Vitamin B-12: 394 pg/mL (ref 211–911)

## 2014-09-22 LAB — RETICULOCYTES (CHCC)
ABS Retic: 76.2 10*3/uL (ref 19.0–186.0)
RBC.: 3.81 MIL/uL — ABNORMAL LOW (ref 3.87–5.11)
Retic Ct Pct: 2 % (ref 0.4–2.3)

## 2014-09-22 LAB — ERYTHROPOIETIN: ERYTHROPOIETIN: 36.7 m[IU]/mL — AB (ref 2.6–18.5)

## 2014-09-24 ENCOUNTER — Ambulatory Visit (HOSPITAL_BASED_OUTPATIENT_CLINIC_OR_DEPARTMENT_OTHER): Payer: BLUE CROSS/BLUE SHIELD

## 2014-09-24 VITALS — BP 98/68 | HR 76 | Temp 97.5°F | Resp 20

## 2014-09-24 DIAGNOSIS — D509 Iron deficiency anemia, unspecified: Secondary | ICD-10-CM

## 2014-09-24 MED ORDER — SODIUM CHLORIDE 0.9 % IV SOLN
510.0000 mg | Freq: Once | INTRAVENOUS | Status: AC
  Administered 2014-09-24: 510 mg via INTRAVENOUS
  Filled 2014-09-24: qty 17

## 2014-09-24 NOTE — Patient Instructions (Signed)

## 2014-09-25 ENCOUNTER — Ambulatory Visit: Payer: BC Managed Care – PPO

## 2014-10-02 ENCOUNTER — Ambulatory Visit (HOSPITAL_BASED_OUTPATIENT_CLINIC_OR_DEPARTMENT_OTHER): Payer: BLUE CROSS/BLUE SHIELD

## 2014-10-02 VITALS — BP 101/50 | HR 70 | Temp 98.0°F | Resp 16

## 2014-10-02 DIAGNOSIS — D509 Iron deficiency anemia, unspecified: Secondary | ICD-10-CM

## 2014-10-02 MED ORDER — SODIUM CHLORIDE 0.9 % IV SOLN
510.0000 mg | Freq: Once | INTRAVENOUS | Status: AC
  Administered 2014-10-02: 510 mg via INTRAVENOUS
  Filled 2014-10-02: qty 17

## 2014-10-02 NOTE — Patient Instructions (Signed)

## 2014-10-07 ENCOUNTER — Telehealth: Payer: Self-pay

## 2014-10-07 NOTE — Telephone Encounter (Signed)
PA approved for MRI cervical and MRI brain approval number [id number].

## 2014-10-15 ENCOUNTER — Telehealth: Payer: Self-pay

## 2014-10-15 NOTE — Telephone Encounter (Signed)
Patient called inquired about MRI I advised patient that after MRI was done she was to call and schedule appt to discuss MRI and also advised patient to bring disc in with her to office visit. Taytem Ghattas,CMA

## 2014-10-21 ENCOUNTER — Encounter: Payer: Self-pay | Admitting: Sports Medicine

## 2014-10-24 ENCOUNTER — Ambulatory Visit (INDEPENDENT_AMBULATORY_CARE_PROVIDER_SITE_OTHER): Payer: BLUE CROSS/BLUE SHIELD | Admitting: Sports Medicine

## 2014-10-24 VITALS — BP 109/64 | HR 62 | Wt 152.0 lb

## 2014-10-24 DIAGNOSIS — R3581 Nocturnal polyuria: Secondary | ICD-10-CM | POA: Insufficient documentation

## 2014-10-24 DIAGNOSIS — D509 Iron deficiency anemia, unspecified: Secondary | ICD-10-CM

## 2014-10-24 DIAGNOSIS — G44209 Tension-type headache, unspecified, not intractable: Secondary | ICD-10-CM

## 2014-10-24 DIAGNOSIS — R351 Nocturia: Secondary | ICD-10-CM | POA: Diagnosis not present

## 2014-10-24 LAB — POCT URINALYSIS DIPSTICK
Bilirubin, UA: NEGATIVE
Glucose, UA: NEGATIVE
Ketones, UA: NEGATIVE
Leukocytes, UA: NEGATIVE
Nitrite, UA: NEGATIVE
Protein, UA: NEGATIVE
Spec Grav, UA: 1.02
Urobilinogen, UA: 0.2
pH, UA: 7.5

## 2014-10-24 MED ORDER — MIRABEGRON ER 50 MG PO TB24: ORAL_TABLET | ORAL | Status: DC

## 2014-10-24 NOTE — Assessment & Plan Note (Signed)
Negative C-spine and brain MRI, headaches are likely tension type, she only gets 4 hours of sleep per night, and has to wake up to void 3-4 times through these 4 hours of sleep.

## 2014-10-24 NOTE — Assessment & Plan Note (Signed)
Status post iron infusion 2 with hematology.

## 2014-10-24 NOTE — Assessment & Plan Note (Signed)
Significant insomnia that seems to be related to nocturnal polyuria approximately 3-4 times at night, and she only sleeps 4-5 hours. She seems exhausted. Symptoms do not sound classic for overactive bladder, and urinalysis is negative with the exception of some blood, she is on her cycle today. She does not have urinary urgency through the day. Hemoglobin A1c in all of the blood work has been normal with the exception of iron deficiency anemia. We are going to start a trial of Myrbetriq in the evening, if no improvement we will start desmopressin at night.

## 2014-10-24 NOTE — Progress Notes (Signed)
  Subjective:    CC: Follow-up  HPI: Trigger finger: Resolved after fourth flexor tendon sheath injection  Iron deficiency anemia: Recently had two iron infusions with Hematology at [facility].  Headaches: Persistent, she did have a negative cervical spine and brain MRI ruling out any possibility of cervical cephalalgia or a primary intracranial process. On further questioning she tells me she sleeps approximately 3-4 hours per night, she also has to wake up approximately 3-4 times through the night to void, denies any frequency, urgency during the day, no burning, dysuria, discharge. She is currently on her cycle. She denies any symptoms of depression or anxiety, no suicidality or homicidality. She simply is extremely busy.  Past medical history, Surgical history, Family history not pertinant except as noted below, Social history, Allergies, and medications have been entered into the medical record, reviewed, and no changes needed.   Review of Systems: No fevers, chills, night sweats, weight loss, chest pain, or shortness of breath.   Objective:    General: Well Developed, well nourished, and in no acute distress.  Neuro: Alert and oriented x3, extra-ocular muscles intact, sensation grossly intact.  HEENT: Normocephalic, atraumatic, pupils equal round reactive to light, neck supple, no masses, no lymphadenopathy, thyroid nonpalpable.  Skin: Warm and dry, no rashes. Cardiac: Regular rate and rhythm, no murmurs rubs or gallops, no lower extremity edema.  Respiratory: Clear to auscultation bilaterally. Not using accessory muscles, speaking in full sentences. Abdomen: Soft, nontender, nondistended, normal bowel sounds, no palpable masses.  Urinalysis is positive for blood however she is currently on her cycle. Everything else is negative.  Impression and Recommendations:

## 2014-11-10 ENCOUNTER — Encounter: Payer: Self-pay | Admitting: Sports Medicine

## 2014-11-25 ENCOUNTER — Telehealth: Payer: Self-pay | Admitting: Hematology & Oncology

## 2014-11-25 NOTE — Telephone Encounter (Signed)
Patient called and cx 11/27/14 apt and stated she will call back to resch

## 2014-11-27 ENCOUNTER — Other Ambulatory Visit: Payer: BC Managed Care – PPO | Admitting: Lab

## 2014-11-27 ENCOUNTER — Ambulatory Visit: Payer: BC Managed Care – PPO | Admitting: Hematology & Oncology

## 2016-02-05 ENCOUNTER — Ambulatory Visit (INDEPENDENT_AMBULATORY_CARE_PROVIDER_SITE_OTHER): Payer: 59 | Admitting: Sports Medicine

## 2016-02-05 ENCOUNTER — Encounter: Payer: Self-pay | Admitting: Sports Medicine

## 2016-02-05 VITALS — BP 103/70 | HR 89 | Resp 16 | Ht 68.0 in | Wt 165.0 lb

## 2016-02-05 DIAGNOSIS — S01312S Laceration without foreign body of left ear, sequela: Secondary | ICD-10-CM

## 2016-02-05 DIAGNOSIS — Z0000 Encounter for general adult medical examination without abnormal findings: Secondary | ICD-10-CM | POA: Diagnosis not present

## 2016-02-05 DIAGNOSIS — Z87410 Personal history of cervical dysplasia: Secondary | ICD-10-CM

## 2016-02-05 DIAGNOSIS — S01312A Laceration without foreign body of left ear, initial encounter: Secondary | ICD-10-CM | POA: Insufficient documentation

## 2016-02-05 DIAGNOSIS — M6530 Trigger finger, unspecified finger: Secondary | ICD-10-CM | POA: Diagnosis not present

## 2016-02-05 NOTE — Assessment & Plan Note (Signed)
Physical as above. Checking routine blood work. She desires referral to a different OB/GYN.

## 2016-02-05 NOTE — Assessment & Plan Note (Signed)
40-year-old response to previous injection, repeat injection today.

## 2016-02-05 NOTE — Assessment & Plan Note (Signed)
Referral to OB/GYN

## 2016-02-05 NOTE — Progress Notes (Signed)
  Subjective:    CC: Annual physical  HPI:  This is a pleasant 40 year old female, it's been sometime since I seen her. She is here for her physical.  Desires referral to a different OB/GYN.  Finger cyst/trigger finger is back, desires her. Injection.  Year abnormalities: Had infection in her left earlobe, now has an elongated scar where her earrings hang.  Past medical history, Surgical history, Family history not pertinant except as noted below, Social history, Allergies, and medications have been entered into the medical record, reviewed, and no changes needed.   Review of Systems: No headache, visual changes, nausea, vomiting, diarrhea, constipation, dizziness, abdominal pain, skin rash, fevers, chills, night sweats, swollen lymph nodes, weight loss, chest pain, body aches, joint swelling, muscle aches, shortness of breath, mood changes, visual or auditory hallucinations.  Objective:    General: Well Developed, well nourished, and in no acute distress.  Neuro: Alert and oriented x3, extra-ocular muscles intact, sensation grossly intact. Cranial nerves II through XII are intact, motor, sensory, and coordinative functions are all intact. HEENT: Normocephalic, atraumatic, pupils equal round reactive to light, neck supple, no masses, no lymphadenopathy, thyroid nonpalpable. Oropharynx, nasopharynx, external ear canals are unremarkable. Slightly elongated left earlobe piercing Skin: Warm and dry, no rashes noted.  Cardiac: Regular rate and rhythm, no murmurs rubs or gallops.  Respiratory: Clear to auscultation bilaterally. Not using accessory muscles, speaking in full sentences.  Abdominal: Soft, nontender, nondistended, positive bowel sounds, no masses, no organomegaly.  Musculoskeletal: Shoulder, elbow, wrist, hip, knee, ankle stable, and with full range of motion.  Procedure: Real-time Ultrasound Guided Injection of right fourth flexor tendon sheath Device: GE Logiq E  Verbal informed  consent obtained.  Time-out conducted.  Noted no overlying erythema, induration, or other signs of local infection.  Skin prepped in a sterile fashion.  Local anesthesia: Topical Ethyl chloride.  With sterile technique and under real time ultrasound guidance:  1/2 mL lidocaine, 1/2 mL kenalog 40 injected easily with a 30-gauge needle Completed without difficulty  Pain immediately resolved suggesting accurate placement of the medication.  Advised to call if fevers/chills, erythema, induration, drainage, or persistent bleeding.  Images permanently stored and available for review in the ultrasound unit.  Impression: Technically successful ultrasound guided injection.  Impression and Recommendations:    The patient was counselled, risk factors were discussed, anticipatory guidance given.

## 2016-02-05 NOTE — Assessment & Plan Note (Signed)
Referral to ENT °

## 2016-02-06 LAB — COMPREHENSIVE METABOLIC PANEL WITH GFR
Albumin: 4.2 g/dL (ref 3.6–5.1)
CO2: 24 mmol/L (ref 20–31)
Calcium: 9.3 mg/dL (ref 8.6–10.2)
Chloride: 106 mmol/L (ref 98–110)
Creat: 0.62 mg/dL (ref 0.50–1.10)
Potassium: 4.1 mmol/L (ref 3.5–5.3)
Sodium: 139 mmol/L (ref 135–146)
Total Protein: 7.1 g/dL (ref 6.1–8.1)

## 2016-02-06 LAB — CBC
HCT: 36.4 % (ref 35.0–45.0)
Hemoglobin: 11.3 g/dL — ABNORMAL LOW (ref 11.7–15.5)
MCH: 27.2 pg (ref 27.0–33.0)
MCHC: 31 g/dL — ABNORMAL LOW (ref 32.0–36.0)
MCV: 87.5 fL (ref 80.0–100.0)
MPV: 12.3 fL (ref 7.5–12.5)
Platelets: 224 10*3/uL (ref 140–400)
RBC: 4.16 MIL/uL (ref 3.80–5.10)
RDW: 12.4 % (ref 11.0–15.0)
WBC: 3 10*3/uL — ABNORMAL LOW (ref 3.8–10.8)

## 2016-02-06 LAB — HEMOGLOBIN A1C
Hgb A1c MFr Bld: 5.6 % (ref <5.7)
Mean Plasma Glucose: 114 mg/dL

## 2016-02-06 LAB — COMPREHENSIVE METABOLIC PANEL
ALT: 10 U/L (ref 6–29)
AST: 14 U/L (ref 10–30)
Alkaline Phosphatase: 44 U/L (ref 33–115)
BUN: 10 mg/dL (ref 7–25)
Glucose, Bld: 89 mg/dL (ref 65–99)
Total Bilirubin: 0.4 mg/dL (ref 0.2–1.2)

## 2016-02-06 LAB — LIPID PANEL
Cholesterol: 120 mg/dL — ABNORMAL LOW (ref 125–200)
HDL: 52 mg/dL (ref >=46)
LDL Cholesterol: 60 mg/dL (ref <130)
Total CHOL/HDL Ratio: 2.3 ratio (ref <=5.0)
Triglycerides: 39 mg/dL (ref <150)
VLDL: 8 mg/dL (ref <30)

## 2016-02-06 LAB — TSH: TSH: 0.69 m[IU]/L

## 2016-02-06 LAB — VITAMIN D 25 HYDROXY (VIT D DEFICIENCY, FRACTURES): Vit D, 25-Hydroxy: 12 ng/mL — ABNORMAL LOW (ref 30–100)

## 2016-02-06 LAB — HIV ANTIBODY (ROUTINE TESTING W REFLEX): HIV 1&2 Ab, 4th Generation: NONREACTIVE

## 2016-02-08 MED ORDER — VITAMIN D (ERGOCALCIFEROL) 1.25 MG (50000 UNIT) PO CAPS: 50000.0000 [IU] | ORAL_CAPSULE | ORAL | Status: DC

## 2016-02-08 NOTE — Addendum Note (Signed)
Addended by: Silverio Decamp on: 02/08/2016 12:19 PM   Modules accepted: Orders

## 2016-10-20 ENCOUNTER — Ambulatory Visit (INDEPENDENT_AMBULATORY_CARE_PROVIDER_SITE_OTHER): Payer: Medicaid Other | Admitting: Sports Medicine

## 2016-10-20 ENCOUNTER — Other Ambulatory Visit: Payer: Self-pay | Admitting: Sports Medicine

## 2016-10-20 ENCOUNTER — Encounter: Payer: Self-pay | Admitting: Sports Medicine

## 2016-10-20 VITALS — BP 102/64 | HR 96 | Ht 68.0 in | Wt 175.0 lb

## 2016-10-20 DIAGNOSIS — Z23 Encounter for immunization: Secondary | ICD-10-CM

## 2016-10-20 DIAGNOSIS — N921 Excessive and frequent menstruation with irregular cycle: Secondary | ICD-10-CM | POA: Diagnosis not present

## 2016-10-20 DIAGNOSIS — Z0000 Encounter for general adult medical examination without abnormal findings: Secondary | ICD-10-CM

## 2016-10-20 DIAGNOSIS — D500 Iron deficiency anemia secondary to blood loss (chronic): Secondary | ICD-10-CM

## 2016-10-20 DIAGNOSIS — D72819 Decreased white blood cell count, unspecified: Secondary | ICD-10-CM | POA: Diagnosis not present

## 2016-10-20 LAB — CBC
HCT: 33.8 % — ABNORMAL LOW (ref 35.0–45.0)
Hemoglobin: 10.6 g/dL — ABNORMAL LOW (ref 11.7–15.5)
MCH: 26.3 pg — ABNORMAL LOW (ref 27.0–33.0)
MCHC: 31.4 g/dL — ABNORMAL LOW (ref 32.0–36.0)
MCV: 83.9 fL (ref 80.0–100.0)
MPV: 10.9 fL (ref 7.5–12.5)
Platelets: 234 K/uL (ref 140–400)
RBC: 4.03 MIL/uL (ref 3.80–5.10)
RDW: 13.5 % (ref 11.0–15.0)
WBC: 3.9 K/uL (ref 3.8–10.8)

## 2016-10-20 LAB — IRON AND TIBC
%SAT: 10 % — ABNORMAL LOW (ref 11–50)
Iron: 37 ug/dL — ABNORMAL LOW (ref 40–190)
TIBC: 389 ug/dL (ref 250–450)
UIBC: 352 ug/dL (ref 125–400)

## 2016-10-20 LAB — VITAMIN B12: Vitamin B-12: 378 pg/mL (ref 200–1100)

## 2016-10-20 LAB — FERRITIN: Ferritin: 7 ng/mL — ABNORMAL LOW (ref 10–154)

## 2016-10-20 LAB — FOLATE: Folate: 19.6 ng/mL (ref >5.4)

## 2016-10-20 MED ORDER — MELOXICAM 15 MG PO TABS: ORAL_TABLET | ORAL | 3 refills | Status: DC

## 2016-10-20 NOTE — Assessment & Plan Note (Signed)
Profound leukopenic in the past, no increased infections. We are going to recheck this today.

## 2016-10-20 NOTE — Assessment & Plan Note (Addendum)
Unremarkable annual physical. Blood work was good 6 months ago with the exception of vitamin D and her CBC.

## 2016-10-20 NOTE — Assessment & Plan Note (Addendum)
She also has some degree of anemia, it down to the nines, is having menometrorrhagia secondary to fibroids. I'm going to refer her to Dr. Hulan Fray with OB/GYN, I'm adding meloxicam, she declines COCs. Adding pelvic and transvaginal ultrasound My suspicion is that she will need either an endometrial ablation or hysterectomy.

## 2016-10-20 NOTE — Addendum Note (Signed)
Addended by: Beatris Ship L on: 10/20/2016 04:17 PM   Modules accepted: Orders

## 2016-10-20 NOTE — Progress Notes (Signed)
  Subjective:    CC: Annual physical   HPI:  Anne Hooper is here for her physical, overall she's doing well, she has a bit of stress from putting her job, currently looking for another one.  Otherwise she has no complaints.  Leukopenia: Checked 6 months ago, no increased infections, agreeable to recheck.  Menometrorrhagia: Bleeds between cycles and excessively during cycles, known history of uterine fibroids. She does not desire anymore children. She tells me she does not want to do daily birth control pills, she does have significant pain an is agreeable to start meloxicam.  Past medical history:  Negative.  See flowsheet/record as well for more information.  Surgical history: Negative.  See flowsheet/record as well for more information.  Family history: Negative.  See flowsheet/record as well for more information.  Social history: Negative.  See flowsheet/record as well for more information.  Allergies, and medications have been entered into the medical record, reviewed, and no changes needed.    Review of Systems: No headache, visual changes, nausea, vomiting, diarrhea, constipation, dizziness, abdominal pain, skin rash, fevers, chills, night sweats, swollen lymph nodes, weight loss, chest pain, body aches, joint swelling, muscle aches, shortness of breath, mood changes, visual or auditory hallucinations.  Objective:    General: Well Developed, well nourished, and in no acute distress.  Neuro: Alert and oriented x3, extra-ocular muscles intact, sensation grossly intact. Cranial nerves II through XII are intact, motor, sensory, and coordinative functions are all intact. HEENT: Normocephalic, atraumatic, pupils equal round reactive to light, neck supple, no masses, no lymphadenopathy, thyroid nonpalpable. Oropharynx, nasopharynx, external ear canals are unremarkable. Skin: Warm and dry, no rashes noted.  Cardiac: Regular rate and rhythm, no murmurs rubs or gallops.  Respiratory: Clear to  auscultation bilaterally. Not using accessory muscles, speaking in full sentences.  Abdominal: Soft, mild tenderness in the lower regions without guarding, rigidity, rebound tenderness, nondistended, positive bowel sounds, no masses, no organomegaly.  Musculoskeletal: Shoulder, elbow, wrist, hip, knee, ankle stable, and with full range of motion.  Impression and Recommendations:    The patient was counselled, risk factors were discussed, anticipatory guidance given.  Annual physical exam Unremarkable annual physical. Blood work was good 6 months ago with the exception of vitamin D and her CBC.  Leukopenia Profound leukopenic in the past, no increased infections. We are going to recheck this today.   Iron deficiency anemia She also has some degree of anemia, it down to the nines, is having menometrorrhagia secondary to fibroids. I'm going to refer her to Dr. Hulan Fray with OB/GYN, I'm adding meloxicam, she declines COCs. Adding pelvic and transvaginal ultrasound My suspicion is that she will need either an endometrial ablation or hysterectomy.

## 2016-10-21 LAB — VITAMIN D 25 HYDROXY (VIT D DEFICIENCY, FRACTURES): Vit D, 25-Hydroxy: 21 ng/mL — ABNORMAL LOW (ref 30–100)

## 2016-10-24 ENCOUNTER — Ambulatory Visit (INDEPENDENT_AMBULATORY_CARE_PROVIDER_SITE_OTHER): Payer: Medicaid Other

## 2016-10-24 DIAGNOSIS — N83291 Other ovarian cyst, right side: Secondary | ICD-10-CM | POA: Diagnosis not present

## 2016-10-24 DIAGNOSIS — D259 Leiomyoma of uterus, unspecified: Secondary | ICD-10-CM

## 2016-10-24 DIAGNOSIS — D500 Iron deficiency anemia secondary to blood loss (chronic): Secondary | ICD-10-CM

## 2016-11-14 ENCOUNTER — Other Ambulatory Visit: Payer: Self-pay | Admitting: Sports Medicine

## 2016-11-21 ENCOUNTER — Other Ambulatory Visit: Payer: Self-pay | Admitting: Emergency Medicine

## 2016-11-21 ENCOUNTER — Telehealth: Payer: Self-pay | Admitting: Emergency Medicine

## 2016-11-21 MED ORDER — VITAMIN D (ERGOCALCIFEROL) 1.25 MG (50000 UNIT) PO CAPS: 50000.0000 [IU] | ORAL_CAPSULE | ORAL | 0 refills | Status: DC

## 2016-11-21 NOTE — Telephone Encounter (Signed)
I sent in the 4 more.  Initially I sent in #8, so the pharmacy needs to check their records.

## 2016-11-21 NOTE — Telephone Encounter (Signed)
Patient called to say she is taking her Vitamin D as instructed for 8 weeks;she is on week 5 and [facility] [address] says she has no more refills. Cannot find this order in chart.

## 2016-12-15 ENCOUNTER — Ambulatory Visit (INDEPENDENT_AMBULATORY_CARE_PROVIDER_SITE_OTHER): Payer: Medicaid Other | Admitting: Sports Medicine

## 2016-12-15 ENCOUNTER — Encounter: Payer: Self-pay | Admitting: Sports Medicine

## 2016-12-15 DIAGNOSIS — L03011 Cellulitis of right finger: Secondary | ICD-10-CM

## 2016-12-15 MED ORDER — DOXYCYCLINE HYCLATE 100 MG PO TABS: 100.0000 mg | ORAL_TABLET | Freq: Two times a day (BID) | ORAL | 0 refills | Status: DC

## 2016-12-15 NOTE — Patient Instructions (Signed)
Paronychia Paronychia is an infection of the skin that surrounds a nail. It usually affects the skin around a fingernail, but it may also occur near a toenail. It often causes pain and swelling around the nail. This condition may come on suddenly or develop over a longer period. In some cases, a collection of pus (abscess) can form near or under the nail. Usually, paronychia is not serious and it clears up with treatment. What are the causes? This condition may be caused by bacteria or fungi. It is commonly caused by either Streptococcus or Staphylococcus bacteria. The bacteria or fungi often cause the infection by getting into the affected area through an opening in the skin, such as a cut or a hangnail. What increases the risk? This condition is more likely to develop in:  People who get their hands wet often, such as those who work as dishwashers, bartenders, or nurses.  People who bite their fingernails or suck their thumbs.  People who trim their nails too short.  People who have hangnails or injured fingertips.  People who get manicures.  People who have diabetes. What are the signs or symptoms? Symptoms of this condition include:  Redness and swelling of the skin near the nail.  Tenderness around the nail when you touch the area.  Pus-filled bumps under the cuticle. The cuticle is the skin at the base or sides of the nail.  Fluid or pus under the nail.  Throbbing pain in the area. How is this diagnosed? This condition is usually diagnosed with a physical exam. In some cases, a sample of pus may be taken from an abscess to be tested in a lab. This can help to determine what type of bacteria or fungi is causing the condition. How is this treated? Treatment for this condition depends on the cause and severity of the condition. If the condition is mild, it may clear up on its own in a few days. Your health care provider may recommend soaking the affected area in warm water a few  times a day. When treatment is needed, the options may include:  Antibiotic medicine, if the condition is caused by a bacterial infection.  Antifungal medicine, if the condition is caused by a fungal infection.  Incision and drainage, if an abscess is present. In this procedure, the health care provider will cut open the abscess so the pus can drain out. Follow these instructions at home:  Soak the affected area in warm water if directed to do so by your health care provider. You may be told to do this for 20 minutes, 2-3 times a day. Keep the area dry in between soakings.  Take medicines only as directed by your health care provider.  If you were prescribed an antibiotic medicine, finish all of it even if you start to feel better.  Keep the affected area clean.  Do not try to drain a fluid-filled bump yourself.  If you will be washing dishes or performing other tasks that require your hands to get wet, wear rubber gloves. You should also wear gloves if your hands might come in contact with irritating substances, such as cleaners or chemicals.  Follow your health care provider's instructions about:  Wound care.  Bandage (dressing) changes and removal. Contact a health care provider if:  Your symptoms get worse or do not improve with treatment.  You have a fever or chills.  You have redness spreading from the affected area.  You have continued or increased fluid,   blood, or pus coming from the affected area.  Your finger or knuckle becomes swollen or is difficult to move. This information is not intended to replace advice given to you by your health care provider. Make sure you discuss any questions you have with your health care provider. Document Released: 03/01/2001 Document Revised: 02/11/2016 Document Reviewed: 08/13/2014 Elsevier Interactive Patient Education  2017 [employer]  

## 2016-12-15 NOTE — Progress Notes (Signed)
  Subjective:    CC: finger swelling  HPI: Ms. Anne Hooper is a 41 y.o. Female with a history of trigger finger who presents with swelling around the lateral nail fold of the right index finger. She says the swelling started four or five days ago, and that the pain is localized to the swollen area. She is unable to fully flex her DIP joint due to the swelling.  Past medical history:  Negative.  See flowsheet/record as well for more information.  Surgical history: Negative.  See flowsheet/record as well for more information.  Family history: Negative.  See flowsheet/record as well for more information.  Social history: Negative.  See flowsheet/record as well for more information.  Allergies, and medications have been entered into the medical record, reviewed, and no changes needed.   Review of Systems: No fevers, chills, night sweats, weight loss, chest pain, or shortness of breath.   Objective:    General: Well Developed, well nourished, and in no acute distress.  Neuro: Alert and oriented x3, extra-ocular muscles intact, sensation grossly intact.  HEENT: Normocephalic, atraumatic, pupils equal round reactive to light, neck supple, no masses, no lymphadenopathy, thyroid nonpalpable.  Skin: Warm and dry, no rashes. Cardiac: Regular rate and rhythm, no murmurs rubs or gallops, no lower extremity edema.  Respiratory: Clear to auscultation bilaterally. Not using accessory muscles, speaking in full sentences. Musculoskeletal: tender swelling of the lateral nail fold of the right index finger, appears inflamed. Pt unable to flex DIP joint due to swelling, but other joints do not exhibit any swelling or restricted ROM.  Impression and Recommendations:    Ms. Anne Hooper is a 41 y.o. Female with a history of trigger finger who presents with lateral paronychia of the right index finger. Pt advised to use warm compresses and was started on doxycicline BID for 7 days. Pt advised to return for incision and  drainage if infection or swelling worsens.

## 2016-12-15 NOTE — Assessment & Plan Note (Signed)
Doxycycline, warm compresses.

## 2016-12-19 ENCOUNTER — Ambulatory Visit (INDEPENDENT_AMBULATORY_CARE_PROVIDER_SITE_OTHER): Payer: Medicaid Other | Admitting: Family Medicine

## 2016-12-19 ENCOUNTER — Encounter: Payer: Self-pay | Admitting: Family Medicine

## 2016-12-19 VITALS — BP 112/57 | HR 79 | Temp 98.9°F | Wt 177.0 lb

## 2016-12-19 DIAGNOSIS — L03011 Cellulitis of right finger: Secondary | ICD-10-CM

## 2016-12-19 MED ORDER — CLINDAMYCIN HCL 300 MG PO CAPS: 300.0000 mg | ORAL_CAPSULE | Freq: Three times a day (TID) | ORAL | 0 refills | Status: DC

## 2016-12-19 NOTE — Patient Instructions (Signed)
Thank you for coming in today. STOP doxycycline.  Let the wound drain.  We are trying to get all the puss out.  Take clindamycin.  We will know what bacteria it is soon.  Recheck later this week if still hurting.   Paronychia Paronychia is an infection of the skin that surrounds a nail. It usually affects the skin around a fingernail, but it may also occur near a toenail. It often causes pain and swelling around the nail. This condition may come on suddenly or develop over a longer period. In some cases, a collection of pus (abscess) can form near or under the nail. Usually, paronychia is not serious and it clears up with treatment. What are the causes? This condition may be caused by bacteria or fungi. It is commonly caused by either Streptococcus or Staphylococcus bacteria. The bacteria or fungi often cause the infection by getting into the affected area through an opening in the skin, such as a cut or a hangnail. What increases the risk? This condition is more likely to develop in:  People who get their hands wet often, such as those who work as Designer, industrial/product, bartenders, or nurses.  People who bite their fingernails or suck their thumbs.  People who trim their nails too short.  People who have hangnails or injured fingertips.  People who get manicures.  People who have diabetes. What are the signs or symptoms? Symptoms of this condition include:  Redness and swelling of the skin near the nail.  Tenderness around the nail when you touch the area.  Pus-filled bumps under the cuticle. The cuticle is the skin at the base or sides of the nail.  Fluid or pus under the nail.  Throbbing pain in the area. How is this diagnosed? This condition is usually diagnosed with a physical exam. In some cases, a sample of pus may be taken from an abscess to be tested in a lab. This can help to determine what type of bacteria or fungi is causing the condition. How is this treated? Treatment for  this condition depends on the cause and severity of the condition. If the condition is mild, it may clear up on its own in a few days. Your health care provider may recommend soaking the affected area in warm water a few times a day. When treatment is needed, the options may include:  Antibiotic medicine, if the condition is caused by a bacterial infection.  Antifungal medicine, if the condition is caused by a fungal infection.  Incision and drainage, if an abscess is present. In this procedure, the health care provider will cut open the abscess so the pus can drain out. Follow these instructions at home:  Soak the affected area in warm water if directed to do so by your health care provider. You may be told to do this for 20 minutes, 2-3 times a day. Keep the area dry in between soakings.  Take medicines only as directed by your health care provider.  If you were prescribed an antibiotic medicine, finish all of it even if you start to feel better.  Keep the affected area clean.  Do not try to drain a fluid-filled bump yourself.  If you will be washing dishes or performing other tasks that require your hands to get wet, wear rubber gloves. You should also wear gloves if your hands might come in contact with irritating substances, such as cleaners or chemicals.  Follow your health care provider's instructions about:  Wound care.  Bandage (dressing) changes and removal. Contact a health care provider if:  Your symptoms get worse or do not improve with treatment.  You have a fever or chills.  You have redness spreading from the affected area.  You have continued or increased fluid, blood, or pus coming from the affected area.  Your finger or knuckle becomes swollen or is difficult to move. This information is not intended to replace advice given to you by your health care provider. Make sure you discuss any questions you have with your health care provider. Document Released:  03/01/2001 Document Revised: 02/11/2016 Document Reviewed: 08/13/2014 Elsevier Interactive Patient Education  2017 [employer].

## 2016-12-19 NOTE — Progress Notes (Signed)
       Anne Hooper is a 41 y.o. female who presents to [facility] today for follow-up paronychia. Patient was seen March 29 for paronychia of her right index finger. This time it was not obvious that there was a abscess. She was tried on doxycycline empirically. She notes over the weekend she's had worsening pain and swelling that has developed a large pocket of pus.   Past Medical History:  Diagnosis Date  . Anemia    Past Surgical History:  Procedure Laterality Date  . CESAREAN SECTION    . OOPHORECTOMY Left   . OVARIAN CYST REMOVAL     Social History  Substance Use Topics  . Smoking status: Never Smoker  . Smokeless tobacco: Never Used  . Alcohol use No   family history is not on file.  ROS as above:  Medications: Current Outpatient Prescriptions  Medication Sig Dispense Refill  . clindamycin (CLEOCIN) 300 MG capsule Take 1 capsule (300 mg total) by mouth 3 (three) times daily. 21 capsule 0   No current facility-administered medications for this visit.    No Known Allergies  Health Maintenance Health Maintenance  Topic Date Due  . TETANUS/TDAP  09/19/2016  . PAP SMEAR  06/18/2017  . HIV Screening  Completed     Exam:  BP (!) 112/57   Pulse 79   Temp 98.9 F (37.2 C) (Oral)   Wt 177 lb (80.3 kg)   BMI 26.91 kg/m  Gen: Well NAD Right index finger with large fluctuant area surrounded by erythema and tenderness. This is next to the nail fold. Pulses capillary refill and sensation are intact  Incision and drainage of paronychia. Consent obtained and timeout performed. Base of the finger cleaned with alcohol) applied in a digital block was achieved using 5 mL of lidocaine without epinephrine. The tip of the finger was nontender to sharp touch.. Review of abscess was cleaned with alcohol and a sharp incision was made. A large amount of purulent  material was drained.  Further pus was expressed. A dressing was applied. Patient tolerated the procedure well.  No results found for this or any previous visit (from the past 72 hour(s)). No results found.    Assessment and Plan: 41 y.o. female with paronychia. Culture pending. Switched to clindamycin antibiotics. Recheck as needed.   Orders Placed This Encounter  Procedures  . Culture, routine-abscess    Left index finger   Meds ordered this encounter  Medications  . DISCONTD: clindamycin (CLEOCIN) 300 MG capsule    Sig: Take 1 capsule (300 mg total) by mouth 3 (three) times daily.    Dispense:  21 capsule    Refill:  0  . clindamycin (CLEOCIN) 300 MG capsule    Sig: Take 1 capsule (300 mg total) by mouth 3 (three) times daily.    Dispense:  21 capsule    Refill:  0     Discussed warning signs or symptoms. Please see discharge instructions. Patient expresses understanding.

## 2016-12-20 ENCOUNTER — Ambulatory Visit: Payer: Medicaid Other | Admitting: Sports Medicine

## 2016-12-22 LAB — CULTURE, ROUTINE-ABSCESS

## 2016-12-26 ENCOUNTER — Other Ambulatory Visit: Payer: Self-pay | Admitting: Sports Medicine

## 2017-01-06 ENCOUNTER — Ambulatory Visit (INDEPENDENT_AMBULATORY_CARE_PROVIDER_SITE_OTHER): Payer: Medicaid Other | Admitting: Physician Assistant

## 2017-01-06 VITALS — BP 113/74 | HR 87 | Temp 98.0°F | Wt 180.0 lb

## 2017-01-06 DIAGNOSIS — N3001 Acute cystitis with hematuria: Secondary | ICD-10-CM | POA: Diagnosis not present

## 2017-01-06 LAB — POCT URINALYSIS DIPSTICK
BILIRUBIN UA: NEGATIVE
GLUCOSE UA: NEGATIVE
KETONES UA: NEGATIVE
Nitrite, UA: POSITIVE
Protein, UA: 100
Urobilinogen, UA: 0.2 E.U./dL
pH, UA: 6 (ref 5.0–8.0)

## 2017-01-06 LAB — POCT URINE PREGNANCY: PREG TEST UR: NEGATIVE

## 2017-01-06 MED ORDER — NITROFURANTOIN MONOHYD MACRO 100 MG PO CAPS: 100.0000 mg | ORAL_CAPSULE | Freq: Two times a day (BID) | ORAL | 0 refills | Status: DC

## 2017-01-06 NOTE — Patient Instructions (Addendum)
- Take antibiotic twice a day for 7 days - You can do over-the-counter Azo for pain if needed   Urinary Tract Infection, Adult A urinary tract infection (UTI) is an infection of any part of the urinary tract, which includes the kidneys, ureters, bladder, and urethra. These organs make, store, and get rid of urine in the body. UTI can be a bladder infection (cystitis) or kidney infection (pyelonephritis). What are the causes? This infection may be caused by fungi, viruses, or bacteria. Bacteria are the most common cause of UTIs. This condition can also be caused by repeated incomplete emptying of the bladder during urination. What increases the risk? This condition is more likely to develop if:  You ignore your need to urinate or hold urine for long periods of time.  You do not empty your bladder completely during urination.  You wipe back to front after urinating or having a bowel movement, if you are female.  You are uncircumcised, if you are female.  You are constipated.  You have a urinary catheter that stays in place (indwelling).  You have a weak defense (immune) system.  You have a medical condition that affects your bowels, kidneys, or bladder.  You have diabetes.  You take antibiotic medicines frequently or for long periods of time, and the antibiotics no longer work well against certain types of infections (antibiotic resistance).  You take medicines that irritate your urinary tract.  You are exposed to chemicals that irritate your urinary tract.  You are female. What are the signs or symptoms? Symptoms of this condition include:  Fever.  Frequent urination or passing small amounts of urine frequently.  Needing to urinate urgently.  Pain or burning with urination.  Urine that smells bad or unusual.  Cloudy urine.  Pain in the lower abdomen or back.  Trouble urinating.  Blood in the urine.  Vomiting or being less hungry than normal.  Diarrhea or  abdominal pain.  Vaginal discharge, if you are female. How is this diagnosed? This condition is diagnosed with a medical history and physical exam. You will also need to provide a urine sample to test your urine. Other tests may be done, including:  Blood tests.  Sexually transmitted disease (STD) testing. If you have had more than one UTI, a cystoscopy or imaging studies may be done to determine the cause of the infections. How is this treated? Treatment for this condition often includes a combination of two or more of the following:  Antibiotic medicine.  Other medicines to treat less common causes of UTI.  Over-the-counter medicines to treat pain.  Drinking enough water to stay hydrated. Follow these instructions at home:  Take over-the-counter and prescription medicines only as told by your health care provider.  If you were prescribed an antibiotic, take it as told by your health care provider. Do not stop taking the antibiotic even if you start to feel better.  Avoid alcohol, caffeine, tea, and carbonated beverages. They can irritate your bladder.  Drink enough fluid to keep your urine clear or pale yellow.  Keep all follow-up visits as told by your health care provider. This is important.  Make sure to:  Empty your bladder often and completely. Do not hold urine for long periods of time.  Empty your bladder before and after sex.  Wipe from front to back after a bowel movement if you are female. Use each tissue one time when you wipe. Contact a health care provider if:  You have back  pain.  You have a fever.  You feel nauseous or vomit.  Your symptoms do not get better after 3 days.  Your symptoms go away and then return. Get help right away if:  You have severe back pain or lower abdominal pain.  You are vomiting and cannot keep down any medicines or water. This information is not intended to replace advice given to you by your health care provider. Make  sure you discuss any questions you have with your health care provider. Document Released: 06/15/2005 Document Revised: 02/17/2016 Document Reviewed: 07/27/2015 Elsevier Interactive Patient Education  2017 [employer].

## 2017-01-06 NOTE — Progress Notes (Signed)
HPI:                                                                Anne Hooper is a 41 y.o. female who presents to [facility] today for UTI symptoms  Urinary Tract Infection   This is a new problem. The current episode started today. The problem occurs every urination. The problem has been unchanged. The quality of the pain is described as burning. There has been no fever. She is sexually active. Associated symptoms include hematuria and urgency. Pertinent negatives include no chills, nausea, possible pregnancy or vomiting. She has tried nothing for the symptoms. There is no history of recurrent UTIs.     Past Medical History:  Diagnosis Date  . Anemia    Past Surgical History:  Procedure Laterality Date  . CESAREAN SECTION    . OOPHORECTOMY Left   . OVARIAN CYST REMOVAL     Social History  Substance Use Topics  . Smoking status: Never Smoker  . Smokeless tobacco: Never Used  . Alcohol use No   family history is not on file.  ROS: negative except as noted in the HPI  Medications: Current Outpatient Prescriptions  Medication Sig Dispense Refill  . nitrofurantoin, macrocrystal-monohydrate, (MACROBID) 100 MG capsule Take 1 capsule (100 mg total) by mouth 2 (two) times daily. 14 capsule 0  . Vitamin D, Ergocalciferol, (DRISDOL) 50000 units CAPS capsule TAKE 1 CAPSULE BY MOUTH EVERY 7 DAYS. TAKE FOR 8 TOTAL DOSES. 4 capsule 0   No current facility-administered medications for this visit.    No Known Allergies     Objective:  BP 113/74   Pulse 87   Temp 98 F (36.7 C) (Oral)   Wt 180 lb (81.6 kg)   BMI 27.37 kg/m  Gen: well-groomed, cooperative, not ill-appearing, no distress Pulm: Normal work of breathing, normal phonation  GI: soft, nondistended, suprapubic tenderness present, no CVA tenderness Neuro: alert and oriented x 3, EOM's intact, normal tone, no tremor MSK: moving all extremities, normal gait and  station Skin: warm and dry, no rashes on exposed skin   Results for orders placed or performed in visit on 01/06/17 (from the past 72 hour(s))  POCT Urinalysis Dipstick     Status: Abnormal   Collection Time: 01/06/17  1:36 PM  Result Value Ref Range   Color, UA yellow    Clarity, UA cloudy    Glucose, UA negaitve    Bilirubin, UA negative    Ketones, UA negative    Spec Grav, UA >=1.030 (A) 1.010 - 1.025   Blood, UA large    pH, UA 6.0 5.0 - 8.0   Protein, UA 100    Urobilinogen, UA 0.2 0.2 or 1.0 E.U./dL   Nitrite, UA positive    Leukocytes, UA Small (1+) (A) Negative  POCT urine pregnancy     Status: Normal   Collection Time: 01/06/17  1:55 PM  Result Value Ref Range   Preg Test, Ur Negative Negative   No results found.    Assessment and Plan: 41 y.o. female with   1. Acute cystitis with hematuria - POCT Urinalysis Dipstick positive for nitrites, large blood and small leuks - POCT urine pregnancy negative - nitrofurantoin, macrocrystal-monohydrate, (MACROBID)  100 MG capsule; Take 1 capsule (100 mg total) by mouth 2 (two) times daily.  Dispense: 14 capsule; Refill: 0 - Urine culture had to be cancelled due to miscommunication in order to perform urine pregnancy. Apparently urine was placed in culture tube prior to verbal order for u-preg. Once that tube is re-opened, the sample is considered contaminated and can't be sent for culture.  Patient education and anticipatory guidance given Patient agrees with treatment plan Follow-up as needed if symptoms worsen or fail to improve  Darlyne Russian PA-C

## 2017-02-07 ENCOUNTER — Ambulatory Visit: Payer: Medicaid Other | Admitting: Sports Medicine

## 2017-02-08 ENCOUNTER — Ambulatory Visit: Payer: Medicaid Other | Admitting: Sports Medicine

## 2017-02-09 ENCOUNTER — Ambulatory Visit (INDEPENDENT_AMBULATORY_CARE_PROVIDER_SITE_OTHER): Payer: Medicaid Other | Admitting: Sports Medicine

## 2017-02-09 ENCOUNTER — Ambulatory Visit (INDEPENDENT_AMBULATORY_CARE_PROVIDER_SITE_OTHER): Payer: Medicaid Other

## 2017-02-09 ENCOUNTER — Encounter: Payer: Self-pay | Admitting: Sports Medicine

## 2017-02-09 DIAGNOSIS — S134XXA Sprain of ligaments of cervical spine, initial encounter: Secondary | ICD-10-CM

## 2017-02-09 MED ORDER — CYCLOBENZAPRINE HCL 10 MG PO TABS: ORAL_TABLET | ORAL | 0 refills | Status: AC

## 2017-02-09 MED ORDER — PREDNISONE 50 MG PO TABS: ORAL_TABLET | ORAL | 0 refills | Status: AC
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
